# Patient Record
Sex: Male | Born: 1945 | Race: White | Hispanic: No | Marital: Married | Smoking: Current some day smoker
Health system: Southern US, Community
[De-identification: ages and names within clinical notes are randomized; demographics above are authoritative.]

## PROBLEM LIST (undated history)

## (undated) DIAGNOSIS — I10 Essential (primary) hypertension: Secondary | ICD-10-CM

## (undated) DIAGNOSIS — K219 Gastro-esophageal reflux disease without esophagitis: Secondary | ICD-10-CM

---

## 2008-11-28 DIAGNOSIS — N400 Enlarged prostate without lower urinary tract symptoms: Secondary | ICD-10-CM | POA: Insufficient documentation

## 2015-04-29 DIAGNOSIS — I10 Essential (primary) hypertension: Secondary | ICD-10-CM | POA: Insufficient documentation

## 2018-04-08 DIAGNOSIS — H8103 Meniere's disease, bilateral: Secondary | ICD-10-CM | POA: Insufficient documentation

## 2018-08-26 DIAGNOSIS — K219 Gastro-esophageal reflux disease without esophagitis: Secondary | ICD-10-CM | POA: Insufficient documentation

## 2022-03-08 ENCOUNTER — Encounter (HOSPITAL_COMMUNITY): Payer: Self-pay | Admitting: Emergency Medicine

## 2022-03-08 ENCOUNTER — Other Ambulatory Visit: Payer: Self-pay

## 2022-03-08 ENCOUNTER — Emergency Department (HOSPITAL_COMMUNITY)
Admission: EM | Admit: 2022-03-08 | Discharge: 2022-03-08 | Disposition: A | Discharge status: Home / Self Care | Payer: Medicare Other | Attending: Emergency Medicine | Admitting: Emergency Medicine

## 2022-03-08 ENCOUNTER — Emergency Department (HOSPITAL_COMMUNITY): Payer: Medicare Other

## 2022-03-08 DIAGNOSIS — S01512A Laceration without foreign body of oral cavity, initial encounter: Secondary | ICD-10-CM | POA: Diagnosis not present

## 2022-03-08 DIAGNOSIS — Z79899 Other long term (current) drug therapy: Secondary | ICD-10-CM | POA: Diagnosis not present

## 2022-03-08 DIAGNOSIS — E860 Dehydration: Secondary | ICD-10-CM | POA: Diagnosis not present

## 2022-03-08 DIAGNOSIS — R42 Dizziness and giddiness: Secondary | ICD-10-CM | POA: Insufficient documentation

## 2022-03-08 DIAGNOSIS — R55 Syncope and collapse: Secondary | ICD-10-CM | POA: Diagnosis present

## 2022-03-08 DIAGNOSIS — I10 Essential (primary) hypertension: Secondary | ICD-10-CM | POA: Diagnosis not present

## 2022-03-08 HISTORY — DX: Essential (primary) hypertension: I10

## 2022-03-08 HISTORY — DX: Gastro-esophageal reflux disease without esophagitis: K21.9

## 2022-03-08 LAB — CBC
HCT: 42.2 % (ref 39.0–52.0)
Hemoglobin: 13.4 g/dL (ref 13.0–17.0)
MCH: 30.4 pg (ref 26.0–34.0)
MCHC: 31.8 g/dL (ref 30.0–36.0)
MCV: 95.7 fL (ref 80.0–100.0)
Platelets: 264 10*3/uL (ref 150–400)
RBC: 4.41 MIL/uL (ref 4.22–5.81)
RDW: 13.2 % (ref 11.5–15.5)
WBC: 15.1 10*3/uL — ABNORMAL HIGH (ref 4.0–10.5)
nRBC: 0 % (ref 0.0–0.2)

## 2022-03-08 LAB — BASIC METABOLIC PANEL
Anion gap: 8 (ref 5–15)
BUN: 17 mg/dL (ref 8–23)
CO2: 26 mmol/L (ref 22–32)
Calcium: 8.8 mg/dL — ABNORMAL LOW (ref 8.9–10.3)
Chloride: 106 mmol/L (ref 98–111)
Creatinine, Ser: 1.26 mg/dL — ABNORMAL HIGH (ref 0.61–1.24)
GFR, Estimated: 60 mL/min — ABNORMAL LOW (ref >60)
Glucose, Bld: 137 mg/dL — ABNORMAL HIGH (ref 70–99)
Potassium: 4.3 mmol/L (ref 3.5–5.1)
Sodium: 140 mmol/L (ref 135–145)

## 2022-03-08 LAB — URINALYSIS, ROUTINE W REFLEX MICROSCOPIC
Bilirubin Urine: NEGATIVE
Glucose, UA: NEGATIVE mg/dL
Hgb urine dipstick: NEGATIVE
Ketones, ur: NEGATIVE mg/dL
Leukocytes,Ua: NEGATIVE
Nitrite: NEGATIVE
Protein, ur: NEGATIVE mg/dL
Specific Gravity, Urine: 1.014 (ref 1.005–1.030)
pH: 6 (ref 5.0–8.0)

## 2022-03-08 LAB — TROPONIN I (HIGH SENSITIVITY)
Troponin I (High Sensitivity): 8 ng/L (ref <18)
Troponin I (High Sensitivity): 9 ng/L (ref <18)

## 2022-03-08 MED ORDER — SODIUM CHLORIDE 0.9 % IV BOLUS
1000.0000 mL | Freq: Once | INTRAVENOUS | Status: AC
  Administered 2022-03-08: 1000 mL via INTRAVENOUS

## 2022-03-08 MED ORDER — LIDOCAINE VISCOUS HCL 2 % MT SOLN: 10.0000 mL | Freq: Four times a day (QID) | OROMUCOSAL | 0 refills | Status: AC | PRN

## 2022-03-08 MED ORDER — LIDOCAINE VISCOUS HCL 2 % MT SOLN
15.0000 mL | Freq: Once | OROMUCOSAL | Status: AC
  Administered 2022-03-08: 15 mL via OROMUCOSAL
  Filled 2022-03-08: qty 15

## 2022-03-08 MED ORDER — LIDOCAINE VISCOUS HCL 2 % MT SOLN
10.0000 mL | Freq: Once | OROMUCOSAL | Status: AC
  Administered 2022-03-08: 10 mL via OROMUCOSAL
  Filled 2022-03-08: qty 15

## 2022-03-08 MED ORDER — CHLORHEXIDINE GLUCONATE 0.12 % MT SOLN: 15.0000 mL | Freq: Two times a day (BID) | OROMUCOSAL | 0 refills | Status: AC

## 2022-03-08 NOTE — ED Notes (Signed)
  Transported to Ct 

## 2022-03-08 NOTE — ED Notes (Signed)
Patient states he was trying to urinate became dizzy and fainted, unsure how long he was out. States he went back to bed and wife states she woke up to him moaning . Patient is currently alert oriented. C/o pain in tongue , he bit the left side of his tongue.

## 2022-03-08 NOTE — Discharge Instructions (Addendum)
Keep yourself hydrated.  Follow-up with your doctor.  As we discussed the seizure is possible and cannot be ruled out at this time so you should not drive and operate heavy machinery until you are cleared to do so by your doctor.  Return to the ED with exertional chest pain, pain associate with shortness of breath, nausea, vomiting, sweating, other concerns.

## 2022-03-08 NOTE — ED Provider Notes (Addendum)
Care assumed from Dr. Eudelia Bunch.  Patient presents after syncopal episode in setting of micturition.  Bit his tongue and hit his head.  Imaging is negative.  EKG is reassuring without arrhythmia.  He is not orthostatic.  He is tolerating p.o. and ambulatory.  Patient is awaiting a second troponin to rule out ACS.  He is tolerating p.o. and ambulatory. He has some pain to his left wrist and has right hip but is able to bear weight and declines any x-rays.  Second troponin is negative.  Patient able to tolerate p.o. and ambulate without dizziness.  Continues to complain of tongue pain.  [place] are visiting from [place].  Patient and wife are anxious about going home and would prefer observation in the hospital today.  Discussed likely has micturition syncope with low suspicion for arrhythmia.  However seizure is also in the differential given his loss of consciousness with tongue biting.  Advise he should not drive or operate heavy machinery until he sees his doctor.  Discussed cannot rule out seizure completely at this time but favor micturition syncope.  Patient would benefit from echocardiogram and possible Holter monitor.  Observation admission d/w Dr. Ephriam Knuckles of internal medicine.  Patient has been seen by [facility] practice team.  He now prefers to go home.  Family practice team feels this is reasonable and suspect likely micturition syncope.  He will follow-up for outpatient studies as outlined above.  Return precautions are discussed.   Glynn Octave, MD 03/08/22 3009    Glynn Octave, MD 03/08/22 1332

## 2022-03-08 NOTE — ED Notes (Signed)
Provided pt water for fluid challenge 

## 2022-03-08 NOTE — ED Notes (Signed)
Pt ambulated in the hallway to the bathroom with standby assist. Pt denies dizziness or feeling lightheaded.

## 2022-03-08 NOTE — ED Provider Notes (Signed)
[facility] EMERGENCY DEPARTMENT Provider Note  CSN: [id number] Arrival date & time: 03/08/22 4098  Chief Complaint(s) Fall and Loss of Consciousness  HPI Alexander Tate is a 76 y.o. male h/o HTN   The history is provided by the patient and the spouse.  Loss of Consciousness Episode history:  Single Most recent episode:  Today Progression:  Resolved Chronicity:  New Context: standing up and urination   Witnessed: no   Associated symptoms: diaphoresis, dizziness, malaise/fatigue and nausea   Associated symptoms: no chest pain, no focal sensory loss, no focal weakness, no headaches, no shortness of breath, no visual change and no vomiting     Past Medical History Past Medical History:  Diagnosis Date   GERD (gastroesophageal reflux disease)    Hypertension    There are no problems to display for this patient.  Home Medication(s) Prior to Admission medications   Medication Sig Start Date End Date Taking? Authorizing Provider  acetaminophen (TYLENOL) 500 MG tablet Take 1,000 mg by mouth every 6 (six) hours as needed for moderate pain or headache.   Yes [provider]  Carboxymethylcellulose Sodium (ARTIFICIAL TEARS OP) Place 1 drop into both eyes daily.   Yes [provider]  chlorhexidine (PERIDEX) 0.12 % solution Use as directed 15 mLs in the mouth or throat 2 (two) times daily. 03/08/22  Yes Maurene Hollin, Amadeo Garnet, MD  finasteride (PROSCAR) 5 MG tablet Take 5 mg by mouth every other day.   Yes [provider]  lidocaine (XYLOCAINE) 2 % solution Use as directed 10 mLs in the mouth or throat every 6 (six) hours as needed (mouth pain). 03/08/22  Yes Yasiel Goyne, Amadeo Garnet, MD  losartan (COZAAR) 100 MG tablet Take 100 mg by mouth daily.   Yes [provider]  Multiple Vitamin (MULTIVITAMIN) tablet Take 1 tablet by mouth daily.   Yes [provider]  omeprazole (PRILOSEC) 40 MG capsule Take 40 mg by mouth daily.   Yes  [provider]  Probiotic Product (PROBIOTIC DAILY) CAPS Take 1 capsule by mouth daily.   Yes [provider]                                                                                                                                    Allergies Patient has no known allergies.  Review of Systems Review of Systems  Constitutional:  Positive for diaphoresis and malaise/fatigue.  Respiratory:  Negative for shortness of breath.   Cardiovascular:  Positive for syncope. Negative for chest pain.  Gastrointestinal:  Positive for nausea. Negative for vomiting.  Neurological:  Positive for dizziness. Negative for focal weakness and headaches.   As noted in HPI  Physical Exam Vital Signs  I have reviewed the triage vital signs BP 135/79   Pulse 64   Temp 98.7 F (37.1 C) (Oral)   Resp 14   SpO2 96%   Physical Exam Vitals reviewed.  Constitutional:      General: He is not in acute distress.    Appearance: He is well-developed. He is not diaphoretic.  HENT:     Head: Normocephalic and atraumatic.     Nose: Nose normal.     Mouth/Throat:   Eyes:     General: No scleral icterus.       Right eye: No discharge.        Left eye: No discharge.     Conjunctiva/sclera: Conjunctivae normal.     Pupils: Pupils are equal, round, and reactive to light.  Cardiovascular:     Rate and Rhythm: Normal rate and regular rhythm.     Heart sounds: No murmur heard.    No friction rub. No gallop.  Pulmonary:     Effort: Pulmonary effort is normal. No respiratory distress.     Breath sounds: Normal breath sounds. No stridor. No rales.  Abdominal:     General: There is no distension.     Palpations: Abdomen is soft.     Tenderness: There is no abdominal tenderness.  Musculoskeletal:        General: No tenderness.     Cervical back: Normal range of motion and neck supple.  Skin:    General: Skin is warm and dry.     Findings: No erythema or rash.  Neurological:     Mental  Status: He is alert and oriented to person, place, and time.     ED Results and Treatments Labs (all labs ordered are listed, but only abnormal results are displayed) Labs Reviewed  BASIC METABOLIC PANEL - Abnormal; Notable for the following components:      Result Value   Glucose, Bld 137 (*)    Creatinine, Ser 1.26 (*)    Calcium 8.8 (*)    GFR, Estimated 60 (*)    All other components within normal limits  CBC - Abnormal; Notable for the following components:   WBC 15.1 (*)    All other components within normal limits  URINALYSIS, ROUTINE W REFLEX MICROSCOPIC  CBG MONITORING, ED  TROPONIN I (HIGH SENSITIVITY)  TROPONIN I (HIGH SENSITIVITY)                                                                                                                         EKG  EKG Interpretation  Date/Time:  Friday March 08 2022 03:53:52 EDT Ventricular Rate:  67 PR Interval:  170 QRS Duration: 106 QT Interval:  436 QTC Calculation: 460 R Axis:   17 Text Interpretation: Normal sinus rhythm Normal ECG No previous ECGs available Confirmed by Drema Pry [phone]) on 03/08/2022 5:49:51 AM       Radiology CT Head Wo Contrast  Result Date: 03/08/2022 CLINICAL DATA:  Minor head trauma due to fall EXAM: CT HEAD WITHOUT CONTRAST CT CERVICAL SPINE WITHOUT CONTRAST TECHNIQUE: Multidetector CT imaging of the head and cervical spine was performed following the standard protocol without intravenous contrast. Multiplanar CT image reconstructions  of the cervical spine were also generated. RADIATION DOSE REDUCTION: This exam was performed according to the departmental dose-optimization program which includes automated exposure control, adjustment of the mA and/or kV according to patient size and/or use of iterative reconstruction technique. COMPARISON:  None Available. FINDINGS: CT HEAD FINDINGS Brain: No evidence of acute infarction, hemorrhage, hydrocephalus, extra-axial collection or mass lesion/mass  effect. Vascular: No hyperdense vessel or unexpected calcification. Skull: Normal. Negative for fracture or focal lesion. Sinuses/Orbits: No acute finding. CT CERVICAL SPINE FINDINGS Alignment: No traumatic malalignment Skull base and vertebrae: No acute fracture. No primary bone lesion or focal pathologic process. Degenerative facet spurring which is advanced on the left at C2-3 and right at C3-4, encroaching on the foramina. Soft tissues and spinal canal: No prevertebral fluid or swelling. No visible canal hematoma. Disc levels:  As above Upper chest: Clear apical lungs IMPRESSION: No evidence of acute intracranial or cervical spine injury. Electronically Signed   By: Tiburcio PeaJonathan  Watts M.D.   On: 03/08/2022 07:11   CT Cervical Spine Wo Contrast  Result Date: 03/08/2022 CLINICAL DATA:  Minor head trauma due to fall EXAM: CT HEAD WITHOUT CONTRAST CT CERVICAL SPINE WITHOUT CONTRAST TECHNIQUE: Multidetector CT imaging of the head and cervical spine was performed following the standard protocol without intravenous contrast. Multiplanar CT image reconstructions of the cervical spine were also generated. RADIATION DOSE REDUCTION: This exam was performed according to the departmental dose-optimization program which includes automated exposure control, adjustment of the mA and/or kV according to patient size and/or use of iterative reconstruction technique. COMPARISON:  None Available. FINDINGS: CT HEAD FINDINGS Brain: No evidence of acute infarction, hemorrhage, hydrocephalus, extra-axial collection or mass lesion/mass effect. Vascular: No hyperdense vessel or unexpected calcification. Skull: Normal. Negative for fracture or focal lesion. Sinuses/Orbits: No acute finding. CT CERVICAL SPINE FINDINGS Alignment: No traumatic malalignment Skull base and vertebrae: No acute fracture. No primary bone lesion or focal pathologic process. Degenerative facet spurring which is advanced on the left at C2-3 and right at C3-4,  encroaching on the foramina. Soft tissues and spinal canal: No prevertebral fluid or swelling. No visible canal hematoma. Disc levels:  As above Upper chest: Clear apical lungs IMPRESSION: No evidence of acute intracranial or cervical spine injury. Electronically Signed   By: Tiburcio PeaJonathan  Watts M.D.   On: 03/08/2022 07:11    Pertinent labs & imaging results that were available during my care of the patient were reviewed by me and considered in my medical decision making (see MDM for details).  Medications Ordered in ED Medications  lidocaine (XYLOCAINE) 2 % viscous mouth solution 10 mL (has no administration in time range)  sodium chloride 0.9 % bolus 1,000 mL (1,000 mLs Intravenous New Bag/Given 03/08/22 0607)  lidocaine (XYLOCAINE) 2 % viscous mouth solution 15 mL (15 mLs Mouth/Throat Given 03/08/22 0600)  Procedures Procedures  (including critical care time)  Medical Decision Making / ED Course    Complexity of Problem:  Co-morbidities/SDOH that complicate the patient evaluation/care: Noted in HPI  Patient's presenting problem/concern, DDX, and MDM listed below: Syncope No associated chest pain or shortness of breath the patient did feel nauseated and diaphoretic.  Will rule out ACS. Likely vasovagal/micturition syncope. We will get screening labs to rule out anemia, metabolic derangements or electrolyte derangements.  We will also assess dehydration Bite to the tongue does not need to be closed. Given likely head trauma, will obtain a CT head and cervical spine to rule out any acute injuries.  Hospitalization Considered:  Yes if ruled in for ACS  Initial Intervention:  Viscous lidocaine and IV fluids    Complexity of Data:   Cardiac Monitoring: The patient was maintained on a cardiac monitor.   I personally viewed and interpreted the cardiac  monitored which showed an underlying rhythm of normal sinus rhythm with rates in the 60s to 70s EKG without acute ischemic changes or evidence of pericarditis.  No acute dysrhythmias or blocks.  Laboratory Tests ordered listed below with my independent interpretation: CBC with mild leukocytosis.  No anemia. No significant electrolyte derangements.  Patient does have mild AKI.  Likely dehydration. Initial troponin negative.   Imaging Studies ordered listed below with my independent interpretation: CT head and cervical spine without acute injuries.     ED Course:    Assessment, Add'l Intervention, and Reassessment: Syncope Likely vasovagal/micturition syncope related to dehydration Ruling out ACS with a delta troponin.  If second troponin is negative, feel patient is stable for discharge home.  Patient care turned over to oncoming provider. Patient case and results discussed in detail; please see their note for further ED managment.     Final Clinical Impression(s) / ED Diagnoses Final diagnoses:  Syncope and collapse  Dehydration           This chart was dictated using voice recognition software.  Despite best efforts to proofread,  errors can occur which can change the documentation meaning.    Nira Conn, MD 03/08/22 [phone]

## 2022-03-08 NOTE — H&P (Signed)
NAME:  Alexander Tate, MRN:  [id number], DOB:  July 07, 1946, LOS: 0 ADMISSION DATE:  03/08/2022, Primary: Pcp, No  CHIEF COMPLAINT:  syncope   Medical Service: Internal Medicine Teaching Service         Attending Physician: Dr. Antony Contras      History of present illness   Alexander Tate is a 76 year old healthy male with hypertension who presented to the ED after a syncopal episode this morning.  He awoke early this morning and walked to the bathroom to urinate. He estimates that he was out of bed for approximately 30 seconds and was standing to urinate when he developed flushing, nausea, and lightheadedness. Pertinently, no chest pain, shortness of breath or palpitations preceding this. He experienced loss of consciousness and awoke on the bathroom floor on his right side. He noted tongue pain and bleeding that suggested he bit his tongue. He continued to feel lightheaded and returned to bed. He did not note any focal neurologic changes or weakness. He was not incontinent. He told his wife about the incident this morning, at which time EMS was called.   Last pre-syncopal episode was when he was in his 30s.  He and his wife reside in [place] and are here visiting family. He denies change in appetite, decreased fluid intake, current respiratory or GI illness. Denies history of cardiovascular disease, seizures, or other major conditions.   At the time of our visit, he feels back to his baseline.   Past Medical History  He,  has a past medical history of GERD (gastroesophageal reflux disease) and Hypertension.   Home Medications     Prior to Admission medications   Medication Sig Start Date End Date Taking? Authorizing Provider  acetaminophen (TYLENOL) 500 MG tablet Take 1,000 mg by mouth every 6 (six) hours as needed for moderate pain or headache.   Yes [provider]  Carboxymethylcellulose Sodium (ARTIFICIAL TEARS OP) Place 1 drop into both eyes daily.   Yes [provider]  chlorhexidine (PERIDEX) 0.12 % solution Use as directed 15 mLs in the mouth or throat 2 (two) times daily. 03/08/22  Yes Cardama, Amadeo Garnet, MD  finasteride (PROSCAR) 5 MG tablet Take 5 mg by mouth every other day.   Yes [provider]  lidocaine (XYLOCAINE) 2 % solution Use as directed 10 mLs in the mouth or throat every 6 (six) hours as needed (mouth pain). 03/08/22  Yes Cardama, Amadeo Garnet, MD  losartan (COZAAR) 100 MG tablet Take 100 mg by mouth daily.   Yes [provider]  Multiple Vitamin (MULTIVITAMIN) tablet Take 1 tablet by mouth daily.   Yes [provider]  omeprazole (PRILOSEC) 40 MG capsule Take 40 mg by mouth daily.   Yes [provider]  Probiotic Product (PROBIOTIC DAILY) CAPS Take 1 capsule by mouth daily.   Yes [provider]    Allergies    Allergies as of 03/08/2022   (No Known Allergies)    Social History   reports that he has been smoking pipe. He has never used smokeless tobacco. He reports current alcohol use of about 5.0 standard drinks of alcohol per week. He reports that he does not use drugs.   Family History   Family history reviewed with pt, no pertinent FH.   Objective   Blood pressure (!) 158/79, pulse 66, temperature 98.7 F (37.1 C), temperature source Oral, resp. rate 18, SpO2 95 %.    General: well appearing male sitting upright  on the stretcher HEENT: MMM, non-bleeding laceration on the left lateral tongue, tender to palpation of the left SCM Cardiac: RRR, no murmur, no LE edema Pulm: breathing comfortably on room air, lungs clear GI: abd soft, non-tender, non-distended. Neuro: a/o x4. No focal neurologic deficit. Skin: warm and dry MSK: normal bulk and tone. No tremor Significant Diagnostic Tests:      Latest Ref Rng & Units 03/08/2022    3:54 AM  CBC  WBC 4.0 - 10.5 K/uL 15.1   Hemoglobin 13.0 - 17.0 g/dL 13.4   Hematocrit 39.0 - 52.0 % 42.2   Platelets 150 - 400 K/uL  264       Latest Ref Rng & Units 03/08/2022    3:54 AM  BMP  Glucose 70 - 99 mg/dL 137   BUN 8 - 23 mg/dL 17   Creatinine 0.61 - 1.24 mg/dL 1.26   Sodium 135 - 145 mmol/L 140   Potassium 3.5 - 5.1 mmol/L 4.3   Chloride 98 - 111 mmol/L 106   CO2 22 - 32 mmol/L 26   Calcium 8.9 - 10.3 mg/dL 8.8    CTH, CT C-spine: no acute traumatic process  Summary  76 year old relatively healthy male with hypertension who presented to the ED after experiencing a syncopal episode early this morning.   Assessment & Plan:    Orthostatic syncope, AKI Supported by prodromal sx and mild AKI suggesting dehydration. Low suspicion for seizure, suspect tongue laceration occurred at the time of hitting his head. Low suspicion for cardiogenic. EKGs and trop unrevealing. Underwent b/l carotid US's last year which did not suggest stenosis.  Received 1L of fluids in ED. Orthostatics normal.  No evidence of intracranial or neck trauma on imaging.   A/P: Suspect his sx were driven by dehydration and possible superimposed vasovagal. I do not think further cardiac workup is necessary at this time given the clear prodromal symptoms and isolated occurrence. He is feeling back to baseline and would like to discharge today which I feel is reasonable. --Message sent to our [facility] schedulers to reach out to him for a follow up appointment sometime the beginning to middle of next week to ensure no recurrent symptoms have occurred and AKI is resolved.  --Needs BMP at follow up visit  Isolated leukocytosis: underwent a 10d course of abx in May for simple diverticulitis. He recently recovered from an upper respiratory infection. He denies any current infectious symptoms. No further workup at this time.  -Repeat CBC at time of hospital follow up.  Hypertension: chronic and stable. Continue losartan  BPH s/p remote hx of TURP. Chronic and stable. Continue finasteride.   Alexander Hansen, MD Internal Medicine Resident PGY-3 [facility] Pager: [phone] 03/08/2022 9:34 AM

## 2022-03-08 NOTE — ED Triage Notes (Signed)
Pt reported to ED for evaluation of fall that occurred this am after walking to bathroom. Pt states he passed out at sink and woke up on the floor. Upon awakening, pt states he was diaphoretic and shaking. States that that he has felt weak since. Also endorses chronic difficulty hearing.

## 2022-03-13 ENCOUNTER — Encounter: Payer: Medicare Other | Admitting: Internal Medicine

## 2022-10-11 IMAGING — CT CT HEAD W/O CM
4 series · 16 of 47 positions shown, 18 images · non-contrast
Comparison: None Available.

CLINICAL DATA: Minor head trauma due to fall



[Series 3: head wo · axial · 0.47mm/px · z∈[-131,-11]mm · 7 of 32 slices shown, 9 images]
[im 4/32  brain]
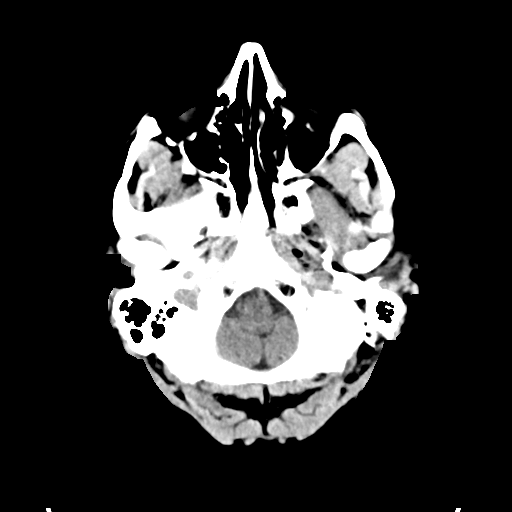
[im 4/32  bone]
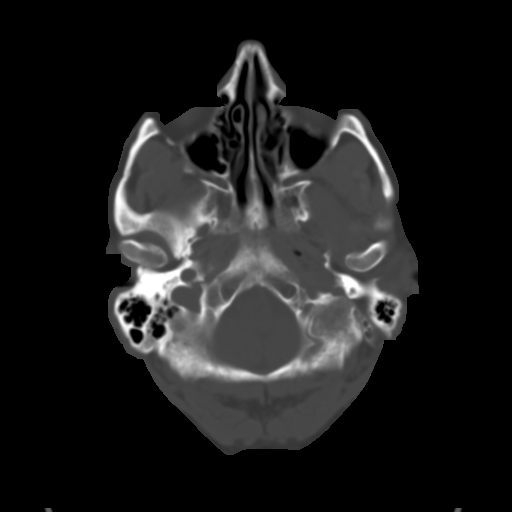
[im 8/32  brain]
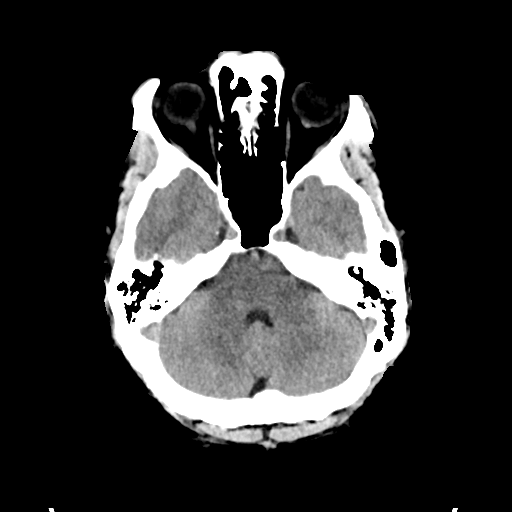
[im 12/32  brain]
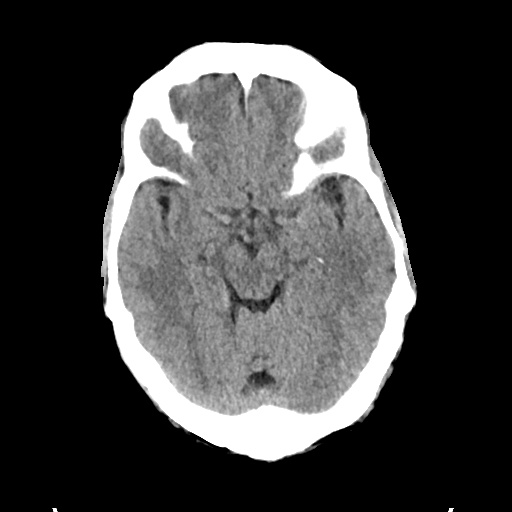
[im 16/32  brain]
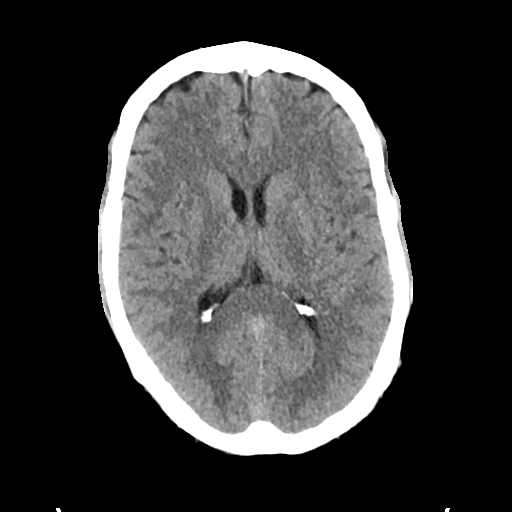
[im 20/32  brain]
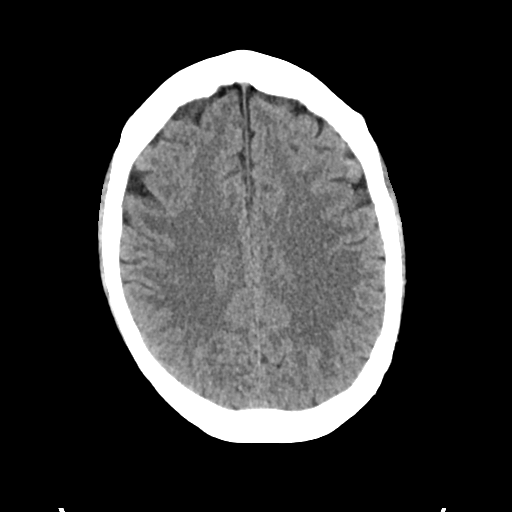
[im 20/32  bone]
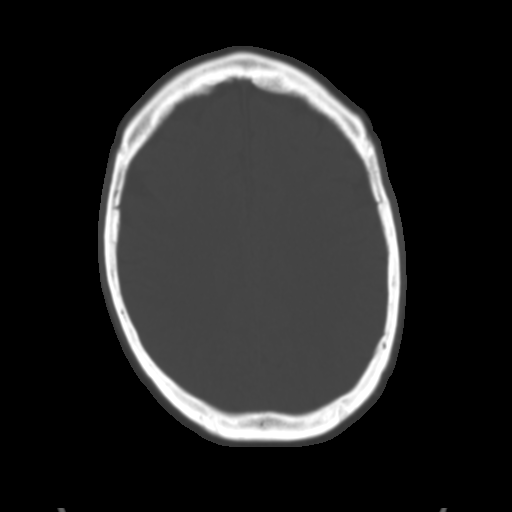
[im 24/32  brain]
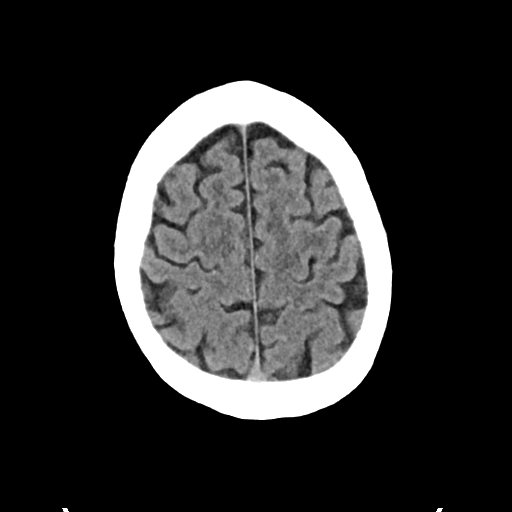
[im 28/32  brain]
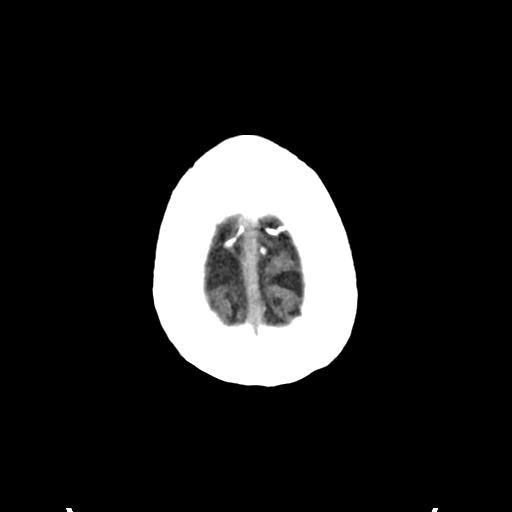

[Series 4: head bone · axial · 0.47mm/px · z∈[-132,-100]mm · 3 of 79 slices shown]
[im 8/79  bone]
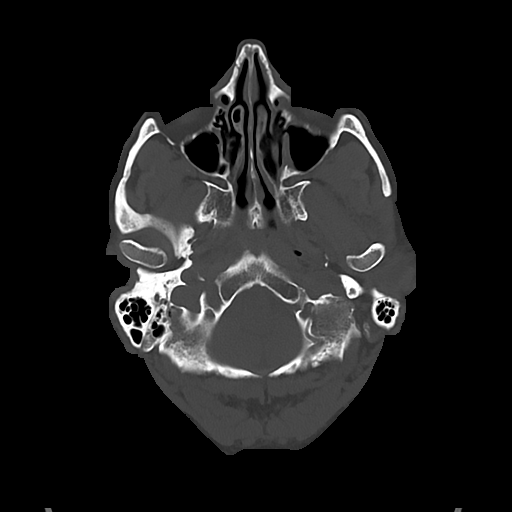
[im 16/79  bone]
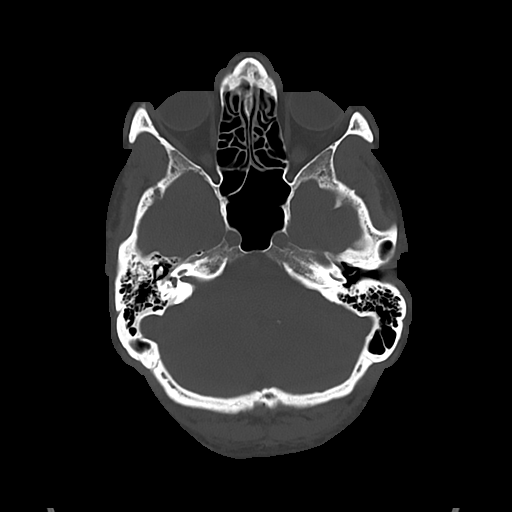
[im 24/79  bone]
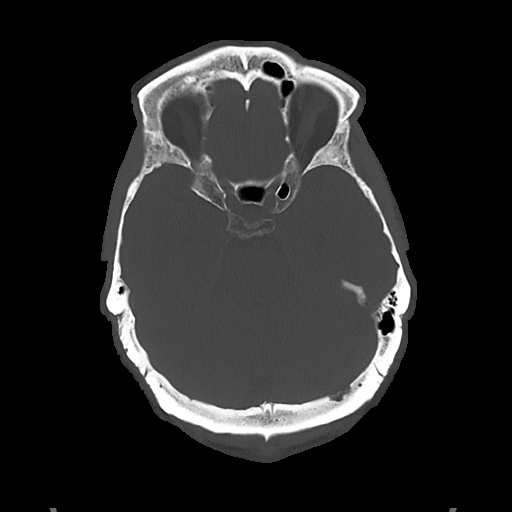

[Series 5: cor soft · coronal · 0.35mm/px · 3 of 71 slices shown]
[im 24/71  brain]
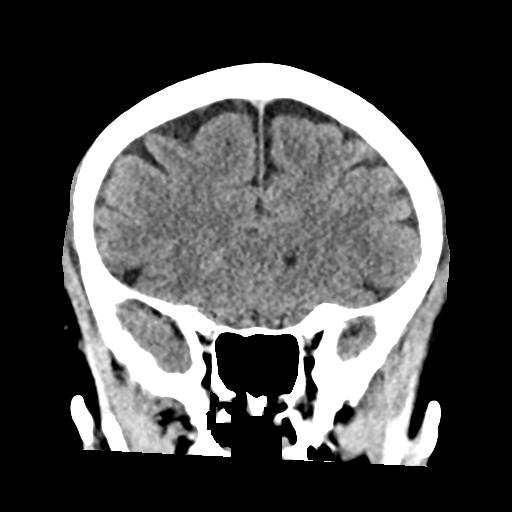
[im 32/71  brain]
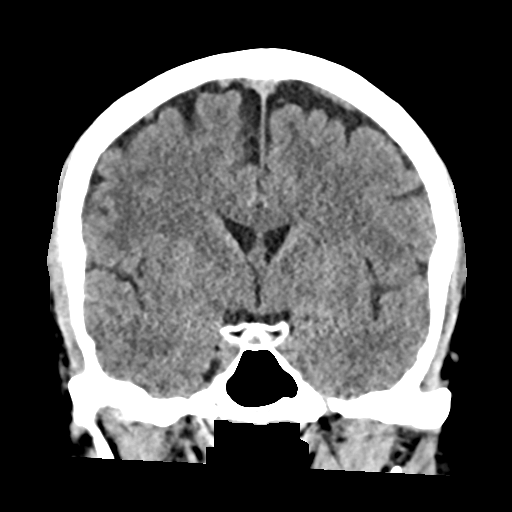
[im 39/71  brain]
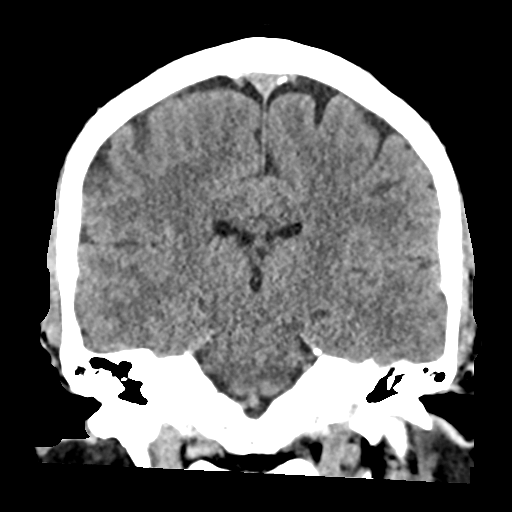

[Series 6: sag soft · sagittal · 0.35mm/px · 3 of 60 slices shown]
[im 20/60  brain]
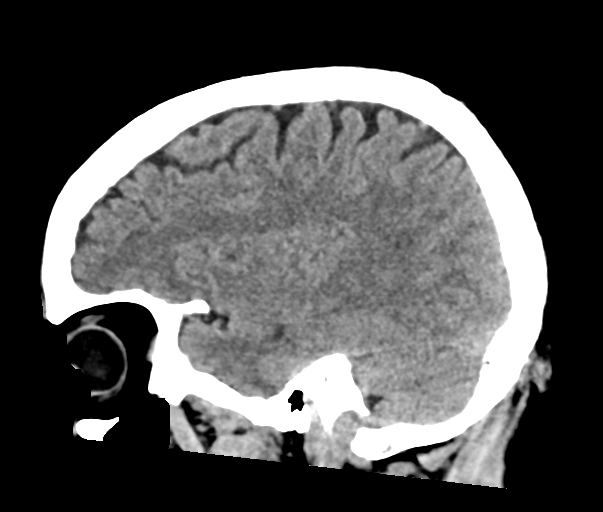
[im 30/60  brain]
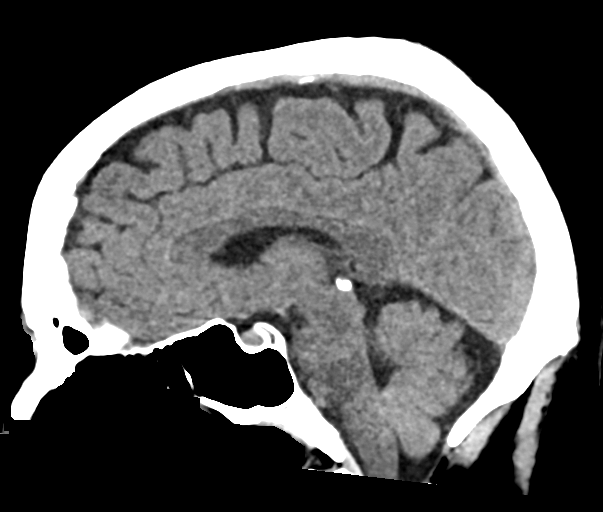
[im 40/60  brain]
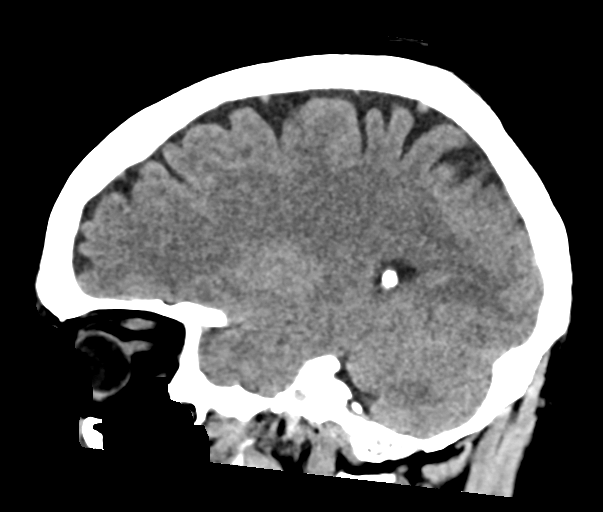

[16 of 47 positions shown; findings below may reference images not displayed]

FINDINGS: CT HEAD FINDINGS

Brain: No evidence of acute infarction, hemorrhage, hydrocephalus,
extra-axial collection or mass lesion/mass effect.

Vascular: No hyperdense vessel or unexpected calcification.

Skull: Normal. Negative for fracture or focal lesion.

Sinuses/Orbits: No acute finding.

CT CERVICAL SPINE FINDINGS

Alignment: No traumatic malalignment

Skull base and vertebrae: No acute fracture. No primary bone lesion
or focal pathologic process. Degenerative facet spurring which is
advanced on the left at C2-3 and right at C3-4, encroaching on the
foramina.

Soft tissues and spinal canal: No prevertebral fluid or swelling. No
visible canal hematoma.

Disc levels:  As above

Upper chest: Clear apical lungs
IMPRESSION: No evidence of acute intracranial or cervical spine injury.
